# Patient Record
Sex: Female | Born: 1970 | Race: White | Hispanic: No | Marital: Married | Smoking: Former smoker
Health system: Southern US, Community
[De-identification: ages and names within clinical notes are randomized; demographics above are authoritative.]

## PROBLEM LIST (undated history)

## (undated) DIAGNOSIS — N816 Rectocele: Secondary | ICD-10-CM

## (undated) DIAGNOSIS — N951 Menopausal and female climacteric states: Secondary | ICD-10-CM

## (undated) DIAGNOSIS — N760 Acute vaginitis: Secondary | ICD-10-CM

## (undated) DIAGNOSIS — D251 Intramural leiomyoma of uterus: Principal | ICD-10-CM

## (undated) DIAGNOSIS — R319 Hematuria, unspecified: Secondary | ICD-10-CM

## (undated) DIAGNOSIS — F419 Anxiety disorder, unspecified: Secondary | ICD-10-CM

## (undated) DIAGNOSIS — K5900 Constipation, unspecified: Secondary | ICD-10-CM

## (undated) DIAGNOSIS — R102 Pelvic and perineal pain: Secondary | ICD-10-CM

## (undated) DIAGNOSIS — B9689 Other specified bacterial agents as the cause of diseases classified elsewhere: Secondary | ICD-10-CM

## (undated) DIAGNOSIS — N898 Other specified noninflammatory disorders of vagina: Secondary | ICD-10-CM

## (undated) HISTORY — DX: Menopausal and female climacteric states: N95.1

## (undated) HISTORY — DX: Intramural leiomyoma of uterus: D25.1

## (undated) HISTORY — DX: Constipation, unspecified: K59.00

## (undated) HISTORY — DX: Hematuria, unspecified: R31.9

## (undated) HISTORY — DX: Anxiety disorder, unspecified: F41.9

## (undated) HISTORY — DX: Rectocele: N81.6

## (undated) HISTORY — DX: Other specified noninflammatory disorders of vagina: N89.8

## (undated) HISTORY — DX: Acute vaginitis: N76.0

## (undated) HISTORY — DX: Pelvic and perineal pain: R10.2

## (undated) HISTORY — DX: Other specified bacterial agents as the cause of diseases classified elsewhere: B96.89

---

## 1997-12-02 ENCOUNTER — Inpatient Hospital Stay (HOSPITAL_COMMUNITY): Admission: AD | Admit: 1997-12-02 | Discharge: 1997-12-03 | Payer: Self-pay | Admitting: Obstetrics and Gynecology

## 1997-12-28 ENCOUNTER — Inpatient Hospital Stay (HOSPITAL_COMMUNITY): Admission: AD | Admit: 1997-12-28 | Discharge: 1997-12-31 | Payer: Self-pay | Admitting: Gynecology

## 1998-01-11 ENCOUNTER — Inpatient Hospital Stay (HOSPITAL_COMMUNITY): Admission: AD | Admit: 1998-01-11 | Discharge: 1998-01-11 | Payer: Self-pay | Admitting: Obstetrics and Gynecology

## 1998-02-06 ENCOUNTER — Inpatient Hospital Stay (HOSPITAL_COMMUNITY): Admission: AD | Admit: 1998-02-06 | Discharge: 1998-02-06 | Payer: Self-pay | Admitting: Obstetrics and Gynecology

## 1998-02-07 ENCOUNTER — Inpatient Hospital Stay (HOSPITAL_COMMUNITY): Admission: AD | Admit: 1998-02-07 | Discharge: 1998-02-10 | Payer: Self-pay | Admitting: Obstetrics and Gynecology

## 1998-03-22 ENCOUNTER — Other Ambulatory Visit: Admission: RE | Admit: 1998-03-22 | Discharge: 1998-03-22 | Payer: Self-pay | Admitting: Gynecology

## 2002-06-03 ENCOUNTER — Ambulatory Visit (HOSPITAL_COMMUNITY): Admission: RE | Admit: 2002-06-03 | Discharge: 2002-06-03 | Payer: Self-pay | Admitting: Obstetrics and Gynecology

## 2002-06-03 ENCOUNTER — Encounter: Payer: Self-pay | Admitting: Obstetrics and Gynecology

## 2003-06-22 ENCOUNTER — Ambulatory Visit (HOSPITAL_COMMUNITY): Admission: RE | Admit: 2003-06-22 | Discharge: 2003-06-22 | Payer: Self-pay | Admitting: Internal Medicine

## 2003-06-22 ENCOUNTER — Encounter: Payer: Self-pay | Admitting: Internal Medicine

## 2008-01-12 ENCOUNTER — Encounter: Admission: RE | Admit: 2008-01-12 | Discharge: 2008-01-12 | Payer: Self-pay | Admitting: Family Medicine

## 2008-04-20 ENCOUNTER — Ambulatory Visit (HOSPITAL_COMMUNITY): Admission: RE | Admit: 2008-04-20 | Discharge: 2008-04-20 | Payer: Self-pay | Admitting: Family Medicine

## 2008-12-23 ENCOUNTER — Ambulatory Visit (HOSPITAL_COMMUNITY): Admission: RE | Admit: 2008-12-23 | Discharge: 2008-12-23 | Payer: Self-pay | Admitting: Family Medicine

## 2010-08-04 ENCOUNTER — Encounter: Admission: RE | Admit: 2010-08-04 | Discharge: 2010-08-04 | Payer: Self-pay | Admitting: Internal Medicine

## 2010-11-04 ENCOUNTER — Encounter: Payer: Self-pay | Admitting: Family Medicine

## 2011-10-30 ENCOUNTER — Other Ambulatory Visit (HOSPITAL_COMMUNITY)
Admission: RE | Admit: 2011-10-30 | Discharge: 2011-10-30 | Disposition: A | Discharge status: Home / Self Care | Payer: BC Managed Care – PPO | Source: Ambulatory Visit | Attending: Obstetrics & Gynecology | Admitting: Obstetrics & Gynecology

## 2011-10-30 DIAGNOSIS — Z01419 Encounter for gynecological examination (general) (routine) without abnormal findings: Secondary | ICD-10-CM | POA: Insufficient documentation

## 2014-05-25 ENCOUNTER — Ambulatory Visit: Payer: Self-pay | Admitting: Adult Health

## 2014-05-28 ENCOUNTER — Other Ambulatory Visit: Payer: Self-pay | Admitting: Adult Health

## 2014-06-02 ENCOUNTER — Ambulatory Visit (INDEPENDENT_AMBULATORY_CARE_PROVIDER_SITE_OTHER): Payer: BC Managed Care – PPO | Admitting: Adult Health

## 2014-06-02 ENCOUNTER — Other Ambulatory Visit (HOSPITAL_COMMUNITY)
Admission: RE | Admit: 2014-06-02 | Discharge: 2014-06-02 | Disposition: A | Discharge status: Home / Self Care | Payer: BC Managed Care – PPO | Source: Ambulatory Visit | Attending: Adult Health | Admitting: Adult Health

## 2014-06-02 ENCOUNTER — Encounter: Payer: Self-pay | Admitting: Adult Health

## 2014-06-02 VITALS — BP 134/70 | HR 74 | Ht 62.0 in

## 2014-06-02 DIAGNOSIS — Z113 Encounter for screening for infections with a predominantly sexual mode of transmission: Secondary | ICD-10-CM | POA: Diagnosis present

## 2014-06-02 DIAGNOSIS — L659 Nonscarring hair loss, unspecified: Secondary | ICD-10-CM

## 2014-06-02 DIAGNOSIS — N898 Other specified noninflammatory disorders of vagina: Secondary | ICD-10-CM

## 2014-06-02 DIAGNOSIS — K5900 Constipation, unspecified: Secondary | ICD-10-CM

## 2014-06-02 DIAGNOSIS — Z1212 Encounter for screening for malignant neoplasm of rectum: Secondary | ICD-10-CM

## 2014-06-02 DIAGNOSIS — Z01419 Encounter for gynecological examination (general) (routine) without abnormal findings: Secondary | ICD-10-CM

## 2014-06-02 DIAGNOSIS — N816 Rectocele: Secondary | ICD-10-CM

## 2014-06-02 DIAGNOSIS — Z1151 Encounter for screening for human papillomavirus (HPV): Secondary | ICD-10-CM | POA: Insufficient documentation

## 2014-06-02 DIAGNOSIS — N760 Acute vaginitis: Secondary | ICD-10-CM

## 2014-06-02 DIAGNOSIS — B9689 Other specified bacterial agents as the cause of diseases classified elsewhere: Secondary | ICD-10-CM | POA: Insufficient documentation

## 2014-06-02 HISTORY — DX: Other specified bacterial agents as the cause of diseases classified elsewhere: N76.0

## 2014-06-02 HISTORY — DX: Other specified bacterial agents as the cause of diseases classified elsewhere: B96.89

## 2014-06-02 HISTORY — DX: Other specified noninflammatory disorders of vagina: N89.8

## 2014-06-02 HISTORY — DX: Constipation, unspecified: K59.00

## 2014-06-02 HISTORY — DX: Rectocele: N81.6

## 2014-06-02 LAB — T3 UPTAKE: T3 UPTAKE: 33 % (ref 22.5–37.0)

## 2014-06-02 LAB — LIPID PANEL
CHOLESTEROL: 156 mg/dL (ref 0–200)
HDL: 50 mg/dL (ref >39)
LDL CALC: 95 mg/dL (ref 0–99)
TRIGLYCERIDES: 56 mg/dL (ref <150)
Total CHOL/HDL Ratio: 3.1 Ratio
VLDL: 11 mg/dL (ref 0–40)

## 2014-06-02 LAB — COMPREHENSIVE METABOLIC PANEL
ALBUMIN: 4.3 g/dL (ref 3.5–5.2)
ALK PHOS: 54 U/L (ref 39–117)
ALT: 11 U/L (ref 0–35)
AST: 14 U/L (ref 0–37)
BUN: 8 mg/dL (ref 6–23)
CO2: 26 mEq/L (ref 19–32)
CREATININE: 0.78 mg/dL (ref 0.50–1.10)
Calcium: 9.2 mg/dL (ref 8.4–10.5)
Chloride: 105 mEq/L (ref 96–112)
GLUCOSE: 77 mg/dL (ref 70–99)
Potassium: 4.5 mEq/L (ref 3.5–5.3)
Sodium: 138 mEq/L (ref 135–145)
Total Bilirubin: 0.3 mg/dL (ref 0.2–1.2)
Total Protein: 6.5 g/dL (ref 6.0–8.3)

## 2014-06-02 LAB — T3: T3 TOTAL: 131.1 ng/dL (ref 80.0–204.0)

## 2014-06-02 LAB — T4: T4 TOTAL: 9 ug/dL (ref 5.0–12.5)

## 2014-06-02 LAB — HEMOCCULT GUIAC POC 1CARD (OFFICE): Fecal Occult Blood, POC: NEGATIVE

## 2014-06-02 LAB — POCT WET PREP (WET MOUNT)

## 2014-06-02 LAB — CBC
HEMATOCRIT: 36.9 % (ref 36.0–46.0)
HEMOGLOBIN: 12.6 g/dL (ref 12.0–15.0)
MCH: 31 pg (ref 26.0–34.0)
MCHC: 34.1 g/dL (ref 30.0–36.0)
MCV: 90.9 fL (ref 78.0–100.0)
Platelets: 313 10*3/uL (ref 150–400)
RBC: 4.06 MIL/uL (ref 3.87–5.11)
RDW: 13.5 % (ref 11.5–15.5)
WBC: 4.5 10*3/uL (ref 4.0–10.5)

## 2014-06-02 LAB — TSH: TSH: 1.493 u[IU]/mL (ref 0.350–4.500)

## 2014-06-02 LAB — T3, FREE: T3, Free: 2.8 pg/mL (ref 2.3–4.2)

## 2014-06-02 LAB — T4, FREE: FREE T4: 1 ng/dL (ref 0.80–1.80)

## 2014-06-02 MED ORDER — METRONIDAZOLE 500 MG PO TABS: 500.0000 mg | ORAL_TABLET | Freq: Two times a day (BID) | ORAL | Status: DC

## 2014-06-02 NOTE — Patient Instructions (Signed)
Physical in 1 year Mammogram yearly Colonoscopy at 36 Call for labs Try prune juice increase water Constipation Constipation is when a person has fewer than three bowel movements a week, has difficulty having a bowel movement, or has stools that are dry, hard, or larger than normal. As people grow older, constipation is more common. If you try to fix constipation with medicines that make you have a bowel movement (laxatives), the problem may get worse. Long-term laxative use may cause the muscles of the colon to become weak. A low-fiber diet, not taking in enough fluids, and taking certain medicines may make constipation worse.  CAUSES   Certain medicines, such as antidepressants, pain medicine, iron supplements, antacids, and water pills.   Certain diseases, such as diabetes, irritable bowel syndrome (IBS), thyroid disease, or depression.   Not drinking enough water.   Not eating enough fiber-rich foods.   Stress or travel.   Lack of physical activity or exercise.   Ignoring the urge to have a bowel movement.   Using laxatives too much.  SIGNS AND SYMPTOMS   Having fewer than three bowel movements a week.   Straining to have a bowel movement.   Having stools that are hard, dry, or larger than normal.   Feeling full or bloated.   Pain in the lower abdomen.   Not feeling relief after having a bowel movement.  DIAGNOSIS  Your health care provider will take a medical history and perform a physical exam. Further testing may be done for severe constipation. Some tests may include:  A barium enema X-ray to examine your rectum, colon, and, sometimes, your small intestine.   A sigmoidoscopy to examine your lower colon.   A colonoscopy to examine your entire colon. TREATMENT  Treatment will depend on the severity of your constipation and what is causing it. Some dietary treatments include drinking more fluids and eating more fiber-rich foods. Lifestyle treatments  may include regular exercise. If these diet and lifestyle recommendations do not help, your health care provider may recommend taking over-the-counter laxative medicines to help you have bowel movements. Prescription medicines may be prescribed if over-the-counter medicines do not work.  HOME CARE INSTRUCTIONS   Eat foods that have a lot of fiber, such as fruits, vegetables, whole grains, and beans.  Limit foods high in fat and processed sugars, such as french fries, hamburgers, cookies, candies, and soda.   A fiber supplement may be added to your diet if you cannot get enough fiber from foods.   Drink enough fluids to keep your urine clear or pale yellow.   Exercise regularly or as directed by your health care provider.   Go to the restroom when you have the urge to go. Do not hold it.   Only take over-the-counter or prescription medicines as directed by your health care provider. Do not take other medicines for constipation without talking to your health care provider first.  [place] IF:   You have bright red blood in your stool.   Your constipation lasts for more than 4 days or gets worse.   You have abdominal or rectal pain.   You have thin, pencil-like stools.   You have unexplained weight loss. MAKE SURE YOU:   Understand these instructions.  Will watch your condition.  Will get help right away if you are not doing well or get worse. Document Released: 06/29/2004 Document Revised: 10/06/2013 Document Reviewed: 07/13/2013 [facility] Patient Information 2015 [place], [place]. This information is not intended  to replace advice given to you by your health care provider. Make sure you discuss any questions you have with your health care provider.  

## 2014-06-02 NOTE — Progress Notes (Signed)
Patient ID: Kathleen Dudley, female   DOB: 1971/01/20, 43 y.o.   MRN: [id number] History of Present Illness: Kathleen Dudley is a 43 year old white female, married in for a pap and physical.She complains of hair loss, vaginal discharge and some constipation.   Current Medications, Allergies, Past Medical History, Past Surgical History, Family History and Social History were reviewed in [employer] record.     Review of Systems: Patient denies any headaches, blurred vision, shortness of breath, chest pain, abdominal pain, problems with urination, or intercourse. No joint pain, has depression and is on Effexor.See HPI for positives.Periods are regular and denies hot flashes.    Physical Exam:BP 134/70  Pulse 74  Ht 5\' 2"  (1.575 m)  LMP 05/13/2014 pt refused to weigh,has quit smoking x 1 year and does not want to see if gained weight. General:  Well developed, well nourished, no acute distress Skin:  Warm and dry Neck:  Midline trachea, normal thyroid Lungs; Clear to auscultation bilaterally Breast:  No dominant palpable mass, retraction, or nipple discharge, hs epidermal cyst at 11-12 o'clock about 4 fb from areola Cardiovascular: Regular rate and rhythm Abdomen:  Soft, non tender, no hepatosplenomegaly Pelvic:  External genitalia is normal in appearance.  The vagina has white discharge slight odor,The cervix is bulbous, pap with HPV and GC/CHL performed.  Uterus is felt to be normal size, shape, and contour.  No    adnexal masses or tenderness noted.wet prep showed few WBC and +Clue cells Rectal: Good sphincter tone, no polyps, or hemorrhoids felt.  Hemoccult negative.+ low rectocele Extremities:  No swelling or varicosities noted Psych:  Alert and cooperative,seems happy   Impression: Yearly gyn exam Rectocele Constipation Vaginal discharge  BV Hair loss    Plan: Check CBC,CMP,TSH and thyroid profile,lipids and Vitamin D Rx flagyl 500 mg 1 bid x 7 days, with 1  refill, no alcohol, review handout on BV Physical in 1 year Mammogram now and yearly Colonoscopy at 50 Review handout on constipation Try prune juice and increase water

## 2014-06-03 LAB — CYTOLOGY - PAP

## 2014-06-06 LAB — VITAMIN D 1,25 DIHYDROXY
Vitamin D 1, 25 (OH)2 Total: 46 pg/mL (ref 18–72)
Vitamin D3 1, 25 (OH)2: 46 pg/mL

## 2014-06-07 ENCOUNTER — Telehealth: Payer: Self-pay | Admitting: Adult Health

## 2014-06-07 NOTE — Telephone Encounter (Signed)
Pt aware of labs and pap 

## 2014-08-16 ENCOUNTER — Encounter: Payer: Self-pay | Admitting: Adult Health

## 2015-11-22 ENCOUNTER — Other Ambulatory Visit: Payer: Self-pay | Admitting: Adult Health

## 2015-11-25 ENCOUNTER — Encounter: Payer: Self-pay | Admitting: Adult Health

## 2015-11-25 ENCOUNTER — Ambulatory Visit (INDEPENDENT_AMBULATORY_CARE_PROVIDER_SITE_OTHER): Payer: BLUE CROSS/BLUE SHIELD | Admitting: Adult Health

## 2015-11-25 VITALS — BP 112/80 | HR 76 | Ht 62.5 in | Wt 165.0 lb

## 2015-11-25 DIAGNOSIS — R319 Hematuria, unspecified: Secondary | ICD-10-CM | POA: Insufficient documentation

## 2015-11-25 DIAGNOSIS — N816 Rectocele: Secondary | ICD-10-CM

## 2015-11-25 DIAGNOSIS — Z01419 Encounter for gynecological examination (general) (routine) without abnormal findings: Secondary | ICD-10-CM

## 2015-11-25 DIAGNOSIS — Z1211 Encounter for screening for malignant neoplasm of colon: Secondary | ICD-10-CM | POA: Diagnosis not present

## 2015-11-25 DIAGNOSIS — N951 Menopausal and female climacteric states: Secondary | ICD-10-CM

## 2015-11-25 DIAGNOSIS — R102 Pelvic and perineal pain: Secondary | ICD-10-CM | POA: Diagnosis not present

## 2015-11-25 DIAGNOSIS — Z1389 Encounter for screening for other disorder: Secondary | ICD-10-CM

## 2015-11-25 DIAGNOSIS — Z01411 Encounter for gynecological examination (general) (routine) with abnormal findings: Secondary | ICD-10-CM | POA: Diagnosis not present

## 2015-11-25 HISTORY — DX: Pelvic and perineal pain: R10.2

## 2015-11-25 HISTORY — DX: Hematuria, unspecified: R31.9

## 2015-11-25 HISTORY — DX: Menopausal and female climacteric states: N95.1

## 2015-11-25 LAB — POCT URINALYSIS DIPSTICK
GLUCOSE UA: NEGATIVE
Leukocytes, UA: NEGATIVE
Nitrite, UA: NEGATIVE
Protein, UA: NEGATIVE

## 2015-11-25 LAB — HEMOCCULT GUIAC POC 1CARD (OFFICE): FECAL OCCULT BLD: NEGATIVE

## 2015-11-25 NOTE — Progress Notes (Signed)
Patient ID: Kathleen Dudley, female   DOB: 09/29/1971, 45 y.o.   MRN: [id number] History of Present Illness: Kathleen Dudley is a 45 year old white female, in for a well woman gyn exam, she had a normal pap with negative HPV 06/02/14.She is complaining of pelvic pain, night sweats and some irregular periods at times.Does not always feel like she needs to pee, but when does feels better.   Current Medications, Allergies, Past Medical History, Past Surgical History, Family History and Social History were reviewed in [employer] record.     Review of Systems: Patient denies any headaches, hearing loss, fatigue, blurred vision, shortness of breath, chest pain,  problems with bowel movements,  or intercourse. No joint pain or mood swings. See HPI for positives.   Physical Exam:BP 112/80 mmHg  Pulse 76  Ht 5' 2.5" (1.588 m)  Wt 165 lb (74.844 kg)  BMI 29.68 kg/m2  LMP 11/12/2015 urine trace blood General:  Well developed, well nourished, no acute distress Skin:  Warm and dry Neck:  Midline trachea, normal thyroid, good ROM, no lymphadenopathy Lungs; Clear to auscultation bilaterally Breast:  No dominant palpable mass, retraction, or nipple discharge Cardiovascular: Regular rate and rhythm Abdomen:  Soft, non tender, no hepatosplenomegaly Pelvic:  External genitalia is normal in appearance, no lesions.  The vagina is normal in appearance. Urethra has no lesions or masses. The cervix is bulbous.  Uterus is felt to be normal size, shape, and contour, mildly tender.  No adnexal masses, LLQ tenderness noted.Bladder is non tender, no masses felt. Rectal: Good sphincter tone, no polyps, or hemorrhoids felt.  Hemoccult negative.+rectocele Extremities/musculoskeletal:  No swelling or varicosities noted, no clubbing or cyanosis Psych:  No mood changes, alert and cooperative,seems happy   Impression: Well woman gyn exam no pap Pelvic pain Hematuria Peri menopause  Rectocele      Plan: UA C&S sent Return in 1 week for gyn [place] Pap and physical in 1 year Check CBC,CMP,TSH and lipids, A1c, vitamin D and [place]  Mammogram yearly Review handout on pelvic pain and peri menopause Will talk when results back

## 2015-11-25 NOTE — Patient Instructions (Signed)
Perimenopause Perimenopause is the time when your body begins to move into the menopause (no menstrual period for 12 straight months). It is a natural process. Perimenopause can begin 2-8 years before the menopause and usually lasts for 1 year after the menopause. During this time, your ovaries may or may not produce an egg. The ovaries vary in their production of estrogen and progesterone hormones each month. This can cause irregular menstrual periods, difficulty getting pregnant, vaginal bleeding between periods, and uncomfortable symptoms. CAUSES  Irregular production of the ovarian hormones, estrogen and progesterone, and not ovulating every month.  Other causes include:  Tumor of the pituitary gland in the brain.  Medical disease that affects the ovaries.  Radiation treatment.  Chemotherapy.  Unknown causes.  Heavy smoking and excessive alcohol intake can bring on perimenopause sooner. SIGNS AND SYMPTOMS   Hot flashes.  Night sweats.  Irregular menstrual periods.  Decreased sex drive.  Vaginal dryness.  Headaches.  Mood swings.  Depression.  Memory problems.  Irritability.  Tiredness.  Weight gain.  Trouble getting pregnant.  The beginning of losing bone cells (osteoporosis).  The beginning of hardening of the arteries (atherosclerosis). DIAGNOSIS  Your health care provider will make a diagnosis by analyzing your age, menstrual history, and symptoms. He or she will do a physical exam and note any changes in your body, especially your female organs. Female hormone tests may or may not be helpful depending on the amount of female hormones you produce and when you produce them. However, other hormone tests may be helpful to rule out other problems. TREATMENT  In some cases, no treatment is needed. The decision on whether treatment is necessary during the perimenopause should be made by you and your health care provider based on how the symptoms are affecting you  and your lifestyle. Various treatments are available, such as:  Treating individual symptoms with a specific medicine for that symptom.  Herbal medicines that can help specific symptoms.  Counseling.  Group therapy. HOME CARE INSTRUCTIONS   Keep track of your menstrual periods (when they occur, how heavy they are, how long between periods, and how long they last) as well as your symptoms and when they started.  Only take over-the-counter or prescription medicines as directed by your health care provider.  Sleep and rest.  Exercise.  Eat a diet that contains calcium (good for your bones) and soy (acts like the estrogen hormone).  Do not smoke.  Avoid alcoholic beverages.  Take vitamin supplements as recommended by your health care provider. Taking vitamin E may help in certain cases.  Take calcium and vitamin D supplements to help prevent bone loss.  Group therapy is sometimes helpful.  Acupuncture may help in some cases. SEEK MEDICAL CARE IF:   You have questions about any symptoms you are having.  You need a referral to a specialist (gynecologist, psychiatrist, or psychologist). SEEK IMMEDIATE MEDICAL CARE IF:   You have vaginal bleeding.  Your period lasts longer than 8 days.  Your periods are recurring sooner than 21 days.  You have bleeding after intercourse.  You have severe depression.  You have pain when you urinate.  You have severe headaches.  You have vision problems.   This information is not intended to replace advice given to you by your health care provider. Make sure you discuss any questions you have with your health care provider.   Document Released: 11/08/2004 Document Revised: 10/22/2014 Document Reviewed: 04/30/2013 Elsevier Interactive Patient Education [employer].  Pelvic Pain, Female Female pelvic pain can be caused by many different things and start from a variety of places. Pelvic pain refers to pain that is located in the  lower half of the abdomen and between your hips. The pain may occur over a short period of time (acute) or may be reoccurring (chronic). The cause of pelvic pain may be related to disorders affecting the female reproductive organs (gynecologic), but it may also be related to the bladder, kidney stones, an intestinal complication, or muscle or skeletal problems. Getting help right away for pelvic pain is important, especially if there has been severe, sharp, or a sudden onset of unusual pain. It is also important to get help right away because some types of pelvic pain can be life threatening.  CAUSES  Below are only some of the causes of pelvic pain. The causes of pelvic pain can be in one of several categories.   Gynecologic.  Pelvic inflammatory disease.  Sexually transmitted infection.  Ovarian cyst or a twisted ovarian ligament (ovarian torsion).  Uterine lining that grows outside the uterus (endometriosis).  Fibroids, cysts, or tumors.  Ovulation.  Pregnancy.  Pregnancy that occurs outside the uterus (ectopic pregnancy).  Miscarriage.  Labor.  Abruption of the placenta or ruptured uterus.  Infection.  Uterine infection (endometritis).  Bladder infection.  Diverticulitis.  Miscarriage related to a uterine infection (septic abortion).  Bladder.  Inflammation of the bladder (cystitis).  Kidney stone(s).  Gastrointestinal.  Constipation.  Diverticulitis.  Neurologic.  Trauma.  Feeling pelvic pain because of mental or emotional causes (psychosomatic).  Cancers of the bowel or pelvis. EVALUATION  Your caregiver will want to take a careful history of your concerns. This includes recent changes in your health, a careful gynecologic history of your periods (menses), and a sexual history. Obtaining your family history and medical history is also important. Your caregiver may suggest a pelvic exam. A pelvic exam will help identify the location and severity of the  pain. It also helps in the evaluation of which organ system may be involved. In order to identify the cause of the pelvic pain and be properly treated, your caregiver may order tests. These tests may include:   A pregnancy test.  Pelvic ultrasonography.  An X-ray exam of the abdomen.  A urinalysis or evaluation of vaginal discharge.  Blood tests. HOME CARE INSTRUCTIONS   Only take over-the-counter or prescription medicines for pain, discomfort, or fever as directed by your caregiver.   Rest as directed by your caregiver.   Eat a balanced diet.   Drink enough fluids to make your urine clear or pale yellow, or as directed.   Avoid sexual intercourse if it causes pain.   Apply warm or cold compresses to the lower abdomen depending on which one helps the pain.   Avoid stressful situations.   Keep a journal of your pelvic pain. Write down when it started, where the pain is located, and if there are things that seem to be associated with the pain, such as food or your menstrual cycle.  Follow up with your caregiver as directed.  SEEK MEDICAL CARE IF:  Your medicine does not help your pain.  You have abnormal vaginal discharge. SEEK IMMEDIATE MEDICAL CARE IF:   You have heavy bleeding from the vagina.   Your pelvic pain increases.   You feel light-headed or faint.   You have chills.   You have pain with urination or blood in your urine.   You have  uncontrolled diarrhea or vomiting.   You have a fever or persistent symptoms for more than 3 days.  You have a fever and your symptoms suddenly get worse.   You are being physically or sexually abused.   This information is not intended to replace advice given to you by your health care provider. Make sure you discuss any questions you have with your health care provider.   Document Released: 08/28/2004 Document Revised: 06/22/2015 Document Reviewed: 01/21/2012 Elsevier Interactive Patient Education [employer] Return in 1 week for [place] Pap and physical in 1 year Mammogram yearly

## 2015-11-26 LAB — COMPREHENSIVE METABOLIC PANEL
A/G RATIO: 1.7 (ref 1.1–2.5)
ALK PHOS: 66 IU/L (ref 39–117)
ALT: 9 IU/L (ref 0–32)
AST: 13 IU/L (ref 0–40)
Albumin: 4.7 g/dL (ref 3.5–5.5)
BILIRUBIN TOTAL: 0.5 mg/dL (ref 0.0–1.2)
BUN/Creatinine Ratio: 10 (ref 9–23)
BUN: 9 mg/dL (ref 6–24)
CHLORIDE: 99 mmol/L (ref 96–106)
CO2: 24 mmol/L (ref 18–29)
Calcium: 9.6 mg/dL (ref 8.7–10.2)
Creatinine, Ser: 0.86 mg/dL (ref 0.57–1.00)
GFR calc non Af Amer: 82 mL/min/{1.73_m2} (ref >59)
GFR, EST AFRICAN AMERICAN: 95 mL/min/{1.73_m2} (ref >59)
Globulin, Total: 2.7 g/dL (ref 1.5–4.5)
Glucose: 89 mg/dL (ref 65–99)
POTASSIUM: 4.6 mmol/L (ref 3.5–5.2)
Sodium: 139 mmol/L (ref 134–144)
Total Protein: 7.4 g/dL (ref 6.0–8.5)

## 2015-11-26 LAB — URINALYSIS, ROUTINE W REFLEX MICROSCOPIC
Bilirubin, UA: NEGATIVE
Glucose, UA: NEGATIVE
KETONES UA: NEGATIVE
LEUKOCYTES UA: NEGATIVE
Nitrite, UA: NEGATIVE
PH UA: 6.5 (ref 5.0–7.5)
Protein, UA: NEGATIVE
RBC, UA: NEGATIVE
SPEC GRAV UA: 1.025 (ref 1.005–1.030)
Urobilinogen, Ur: 0.2 mg/dL (ref 0.2–1.0)

## 2015-11-26 LAB — HEMOGLOBIN A1C
Est. average glucose Bld gHb Est-mCnc: 111 mg/dL
HEMOGLOBIN A1C: 5.5 % (ref 4.8–5.6)

## 2015-11-26 LAB — CBC
HEMATOCRIT: 38.3 % (ref 34.0–46.6)
HEMOGLOBIN: 12.8 g/dL (ref 11.1–15.9)
MCH: 30.3 pg (ref 26.6–33.0)
MCHC: 33.4 g/dL (ref 31.5–35.7)
MCV: 91 fL (ref 79–97)
PLATELETS: 400 10*3/uL — AB (ref 150–379)
RBC: 4.22 x10E6/uL (ref 3.77–5.28)
RDW: 13.4 % (ref 12.3–15.4)
WBC: 4.9 10*3/uL (ref 3.4–10.8)

## 2015-11-26 LAB — URINE CULTURE: ORGANISM ID, BACTERIA: NO GROWTH

## 2015-11-26 LAB — VITAMIN D 25 HYDROXY (VIT D DEFICIENCY, FRACTURES): Vit D, 25-Hydroxy: 37.6 ng/mL (ref 30.0–100.0)

## 2015-11-26 LAB — LIPID PANEL
Chol/HDL Ratio: 3.9 ratio units (ref 0.0–4.4)
Cholesterol, Total: 182 mg/dL (ref 100–199)
HDL: 47 mg/dL (ref >39)
LDL Calculated: 120 mg/dL — ABNORMAL HIGH (ref 0–99)
Triglycerides: 74 mg/dL (ref 0–149)
VLDL CHOLESTEROL CAL: 15 mg/dL (ref 5–40)

## 2015-11-26 LAB — TSH: TSH: 1.1 u[IU]/mL (ref 0.450–4.500)

## 2015-11-26 LAB — FOLLICLE STIMULATING HORMONE: FSH: 7.3 m[IU]/mL

## 2015-11-29 ENCOUNTER — Telehealth: Payer: Self-pay | Admitting: Adult Health

## 2015-11-29 NOTE — Telephone Encounter (Signed)
Pt aware of labs, decrease fats and carbs and increase exercise

## 2015-11-29 NOTE — Telephone Encounter (Signed)
Left message to call me.

## 2015-12-02 ENCOUNTER — Other Ambulatory Visit: Payer: BLUE CROSS/BLUE SHIELD

## 2015-12-08 ENCOUNTER — Ambulatory Visit (INDEPENDENT_AMBULATORY_CARE_PROVIDER_SITE_OTHER): Payer: BLUE CROSS/BLUE SHIELD

## 2015-12-08 DIAGNOSIS — D259 Leiomyoma of uterus, unspecified: Secondary | ICD-10-CM

## 2015-12-08 DIAGNOSIS — R102 Pelvic and perineal pain: Secondary | ICD-10-CM

## 2015-12-08 DIAGNOSIS — N854 Malposition of uterus: Secondary | ICD-10-CM

## 2015-12-08 NOTE — Progress Notes (Signed)
PELVIC US TA/TV:heterogenous anteverted uterus w/mult fibroids,(#1) post rt intramural fibroid 4.3 x 5.7 x 4.2cm,(#2) pedunculated fundal lt fibroid 1.5 x 1.6 x 1.4 cm,normal ov's bilat(mobile),EEC 8.37mm,no free fluid seen,no pain during ultrasound

## 2015-12-08 NOTE — Progress Notes (Signed)
Pelvis ultrasound today. Anteverted uterus measuring 8.1 x 5.7 x  Cm. Multiple fibroids with largest measuring x x cm.

## 2015-12-09 ENCOUNTER — Telehealth: Payer: Self-pay | Admitting: Adult Health

## 2015-12-09 NOTE — Telephone Encounter (Signed)
Left message to call about US  

## 2015-12-12 ENCOUNTER — Telehealth: Payer: Self-pay | Admitting: Adult Health

## 2015-12-12 ENCOUNTER — Encounter: Payer: Self-pay | Admitting: Adult Health

## 2015-12-12 DIAGNOSIS — D251 Intramural leiomyoma of uterus: Secondary | ICD-10-CM

## 2015-12-12 HISTORY — DX: Intramural leiomyoma of uterus: D25.1

## 2015-12-12 NOTE — Telephone Encounter (Signed)
Left message that US showed multiple fibroids and that ovaries are normal, call be back with any questions

## 2017-07-15 ENCOUNTER — Telehealth: Payer: Self-pay | Admitting: *Deleted

## 2017-07-15 NOTE — Telephone Encounter (Signed)
Spoke with pt. Pt feels a lump in right breast. Noticed it last week. JAG advised she needs to be seen here for exam. Pt voiced understanding and call was transferred to front desk for appt. [place]

## 2017-07-16 ENCOUNTER — Ambulatory Visit (INDEPENDENT_AMBULATORY_CARE_PROVIDER_SITE_OTHER): Payer: BLUE CROSS/BLUE SHIELD | Admitting: Adult Health

## 2017-07-16 ENCOUNTER — Encounter: Payer: Self-pay | Admitting: Adult Health

## 2017-07-16 VITALS — BP 108/80 | HR 73 | Ht 62.0 in | Wt 139.0 lb

## 2017-07-16 DIAGNOSIS — N6312 Unspecified lump in the right breast, upper inner quadrant: Secondary | ICD-10-CM | POA: Diagnosis not present

## 2017-07-16 NOTE — Progress Notes (Signed)
Subjective:     Patient ID: Kathleen Dudley, female   DOB: 1971-07-01, 46 y.o.   MRN: [id number]  HPI Kathleen Dudley is a 46 year old white female, married complaining of right breast lump.Noticed last week.   Review of Systems Right breast lump for about a week, feels smaller and less tender Reviewed past medical,surgical, social and family history. Reviewed medications and allergies.     Objective:   Physical Exam BP 108/80 (BP Location: Right Arm, Patient Position: Sitting, Cuff Size: Normal)   Pulse 73   Ht 5\' 2"  (1.575 m)   Wt 139 lb (63 kg)   LMP 07/13/2017 (Approximate)   BMI 25.42 kg/m    Skin warm and dry,  Breasts:no dominate palpable mass, retraction or nipple discharge on left, on right, no retraction or nipple discharge, has slightly tender mobile 3 cm mass at 12 o'clock, also has fibroglandular tissue in that area    Assessment:     1. Mass of upper inner quadrant of right breast       Plan:     Diagnostic bilateral mammogram and [place] 10/4 at 2:30 pm at [facility] in [place] F/U with me in 2 weeks to discuss periods

## 2017-07-18 ENCOUNTER — Ambulatory Visit
Admission: RE | Admit: 2017-07-18 | Discharge: 2017-07-18 | Disposition: A | Discharge status: Home / Self Care | Payer: BLUE CROSS/BLUE SHIELD | Source: Ambulatory Visit | Attending: Adult Health | Admitting: Adult Health

## 2017-07-18 ENCOUNTER — Other Ambulatory Visit: Payer: Self-pay | Admitting: Adult Health

## 2017-07-18 DIAGNOSIS — N6312 Unspecified lump in the right breast, upper inner quadrant: Secondary | ICD-10-CM

## 2017-07-30 ENCOUNTER — Encounter: Payer: Self-pay | Admitting: Adult Health

## 2017-07-30 ENCOUNTER — Ambulatory Visit (INDEPENDENT_AMBULATORY_CARE_PROVIDER_SITE_OTHER): Payer: BLUE CROSS/BLUE SHIELD | Admitting: Adult Health

## 2017-07-30 VITALS — BP 102/56 | HR 80 | Resp 18 | Ht 62.0 in | Wt 137.0 lb

## 2017-07-30 DIAGNOSIS — N946 Dysmenorrhea, unspecified: Secondary | ICD-10-CM | POA: Diagnosis not present

## 2017-07-30 DIAGNOSIS — N920 Excessive and frequent menstruation with regular cycle: Secondary | ICD-10-CM

## 2017-07-30 MED ORDER — NORETHINDRONE 0.35 MG PO TABS: 1.0000 | ORAL_TABLET | Freq: Every day | ORAL | 11 refills | Status: AC

## 2017-07-30 NOTE — Patient Instructions (Signed)
Start micronor with next period  F/U in 3 months

## 2017-07-30 NOTE — Progress Notes (Signed)
Subjective:     Patient ID: Kathleen Dudley, female   DOB: 07/03/1971, 46 y.o.   MRN: [id number]  HPI Reeve is 46 year old white female in to discuss periods, was seen recently for breast mass, which was felt to be hematoma and has F/U US in 1 month to reassess.Periods heavy and last about 5-7 days and are heavy about 2-3 days, and changes tampons every 2 hours on those heavy days.She says she has had period cramps her whole life. She does Vap.   Review of Systems Heavy periods + period cramps Reviewed past medical,surgical, social and family history. Reviewed medications and allergies.     Objective:   Physical Exam BP (!) 102/56 (BP Location: Left Arm, Patient Position: Sitting, Cuff Size: Normal)   Pulse 80   Resp 18   Ht 5\' 2"  (1.575 m)   Wt 137 lb (62.1 kg)   LMP 07/13/2017 (Approximate)   SpO2 99%   BMI 25.06 kg/m    Skin warm and dry.Lungs: clear to ausculation bilaterally. Cardiovascular: regular rate and rhythm.Had [place] 12/08/15 that showed fibroids.  Discussed IUD, ablation and POP, will try Micronor first.   Assessment:     1. Menorrhagia with regular cycle   2. Menstrual cramps       Plan:    Start with next period Meds ordered this encounter  Medications  . norethindrone (MICRONOR,CAMILA,ERRIN) 0.35 MG tablet    Sig: Take 1 tablet (0.35 mg total) by mouth daily.    Dispense:  1 Package    Refill:  11    Order Specific Question:   Supervising Provider    Answer:   Tania Ade H [[id number]]  F/U in 3 months    Review handout on ablation

## 2017-08-19 ENCOUNTER — Ambulatory Visit
Admission: RE | Admit: 2017-08-19 | Discharge: 2017-08-19 | Disposition: A | Discharge status: Home / Self Care | Payer: BLUE CROSS/BLUE SHIELD | Source: Ambulatory Visit | Attending: Adult Health | Admitting: Adult Health

## 2017-08-19 ENCOUNTER — Other Ambulatory Visit: Payer: Self-pay | Admitting: Adult Health

## 2017-08-19 DIAGNOSIS — N6310 Unspecified lump in the right breast, unspecified quadrant: Secondary | ICD-10-CM

## 2017-08-19 DIAGNOSIS — N6312 Unspecified lump in the right breast, upper inner quadrant: Secondary | ICD-10-CM

## 2017-10-28 ENCOUNTER — Ambulatory Visit: Payer: BLUE CROSS/BLUE SHIELD | Admitting: Adult Health

## 2017-11-26 IMAGING — MG 2D DIGITAL DIAGNOSTIC BILATERAL MAMMOGRAM WITH CAD AND ADJUNCT T
8 of 19 series · 8 of 40 positions shown · non-contrast
Comparison: Previous exam(s).

CLINICAL DATA: 46-year-old female with painful right breast lump
for approximately 1 week. Patient reports interval improvement in
her symptoms with decrease in the size of the mass over the course
of the last week. She does not recall a specific instance of trauma.

EXAM:
2D DIGITAL DIAGNOSTIC BILATERAL MAMMOGRAM WITH CAD AND ADJUNCT TOMO
ULTRASOUND RIGHT BREAST

[R TAN synth-2D]
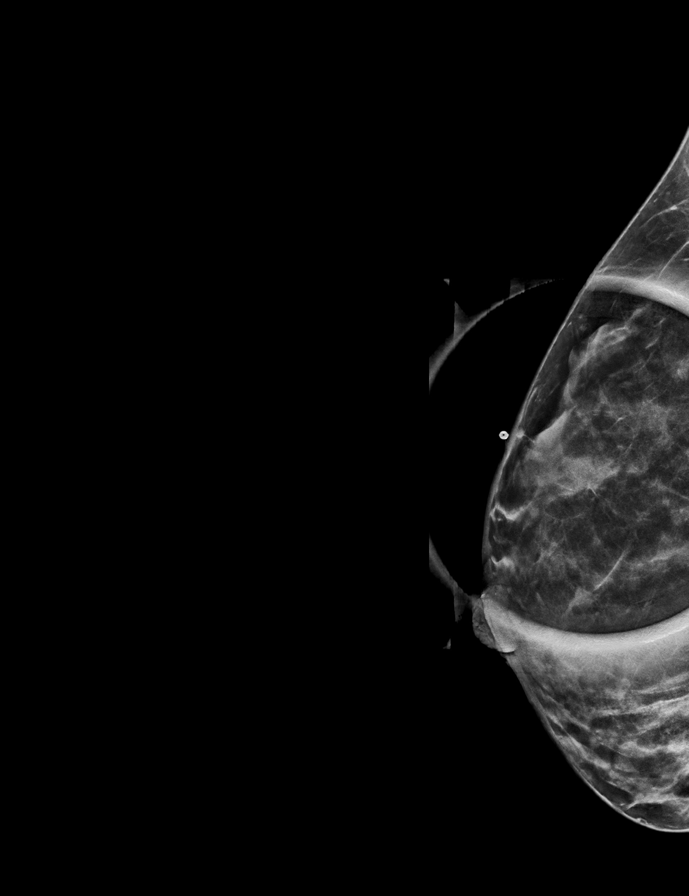

[L CC synth-2D]
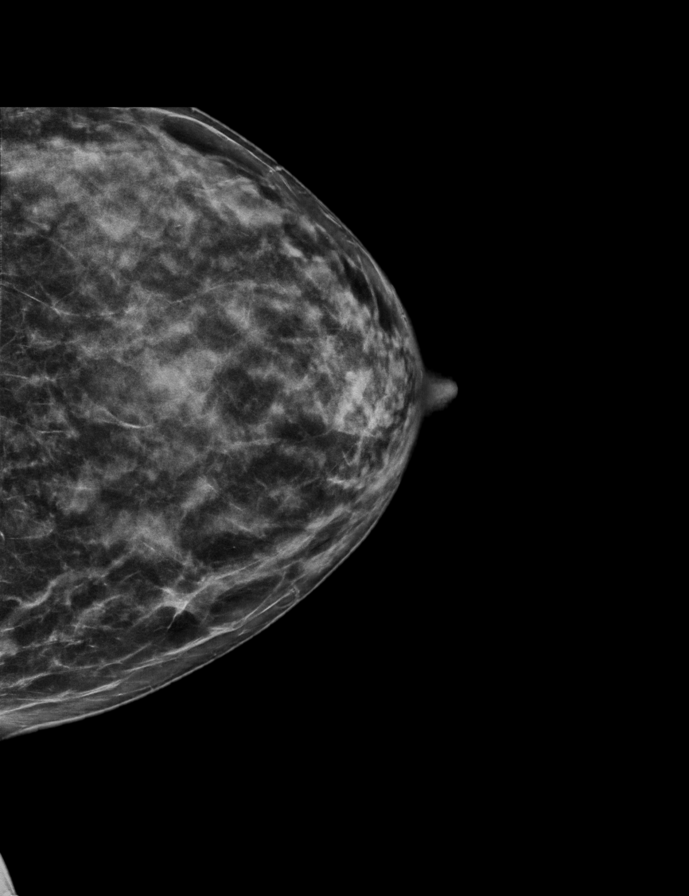

[R MLO synth-2D]
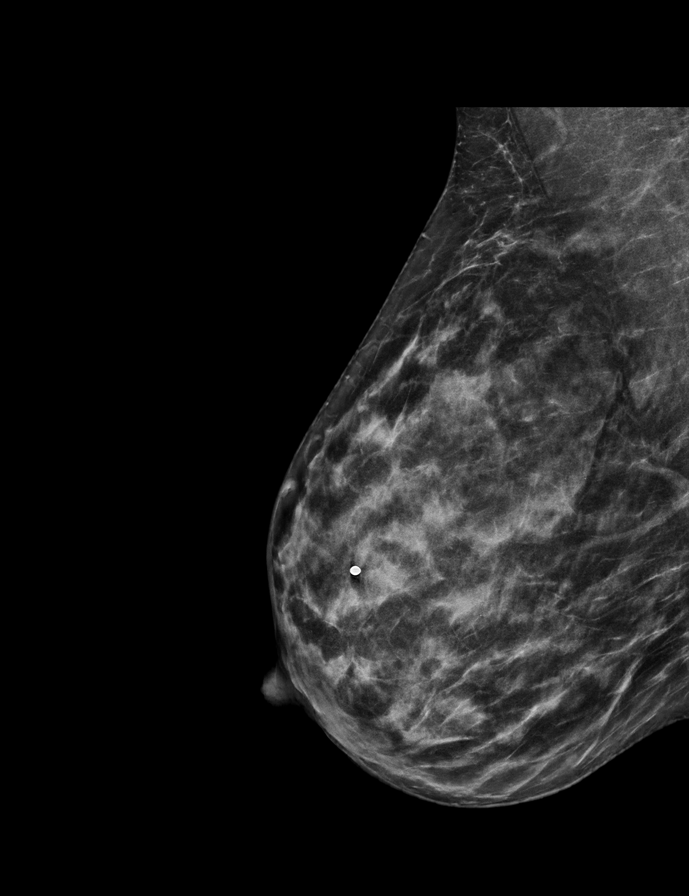

[R CC synth-2D]
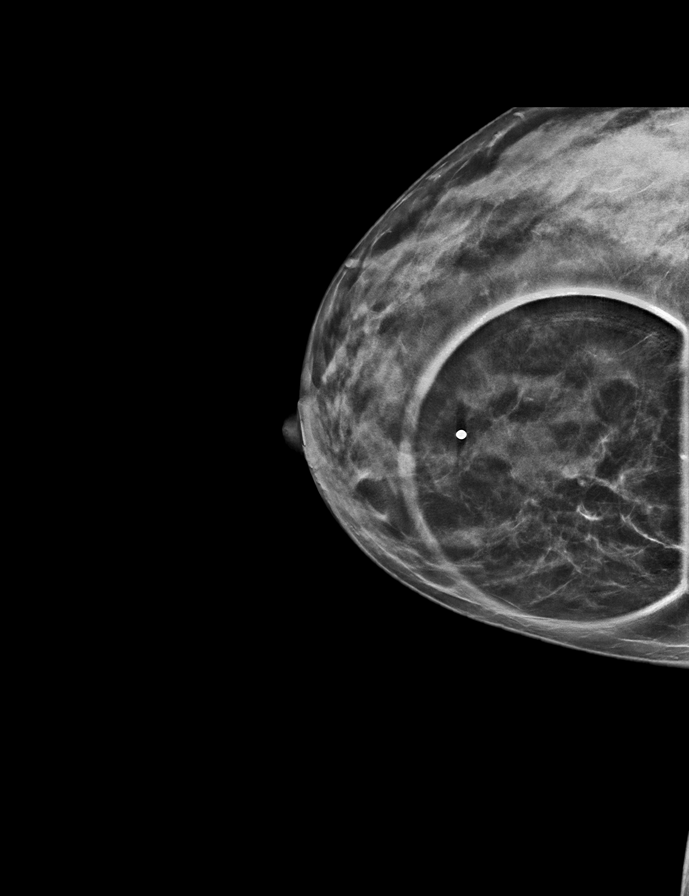

[R CC]
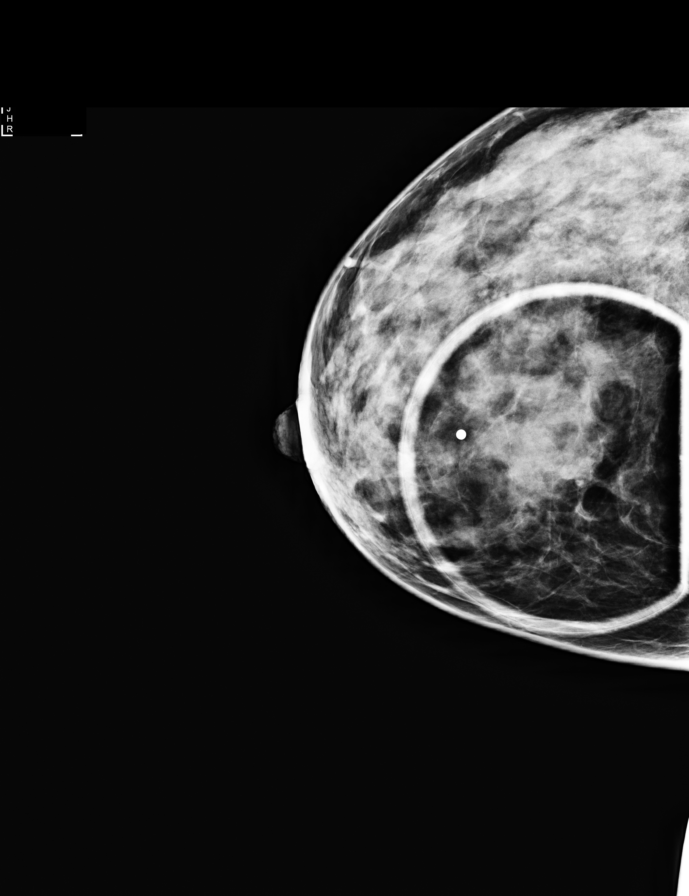

[L MLO]
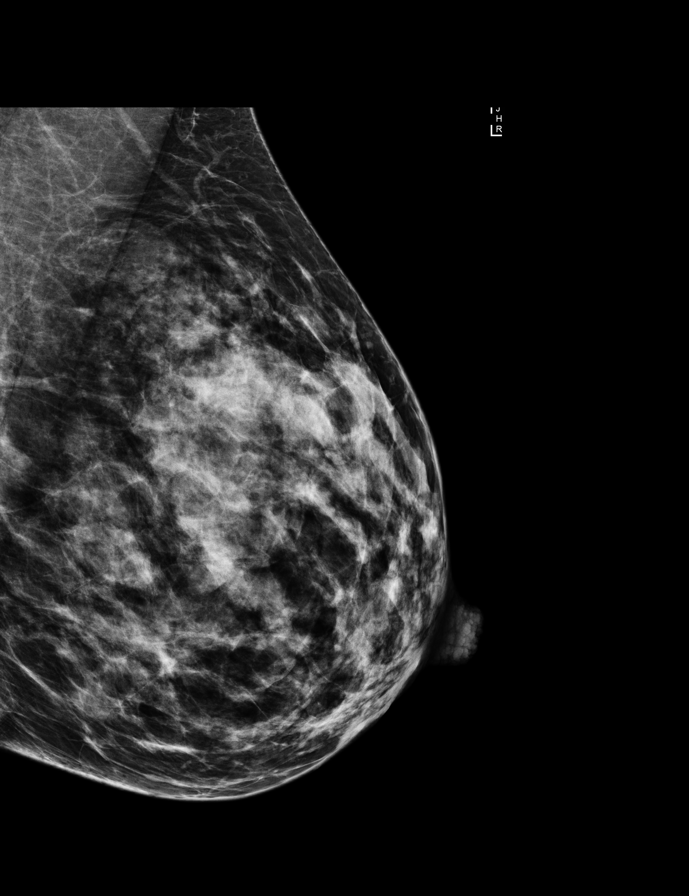

[R ML]
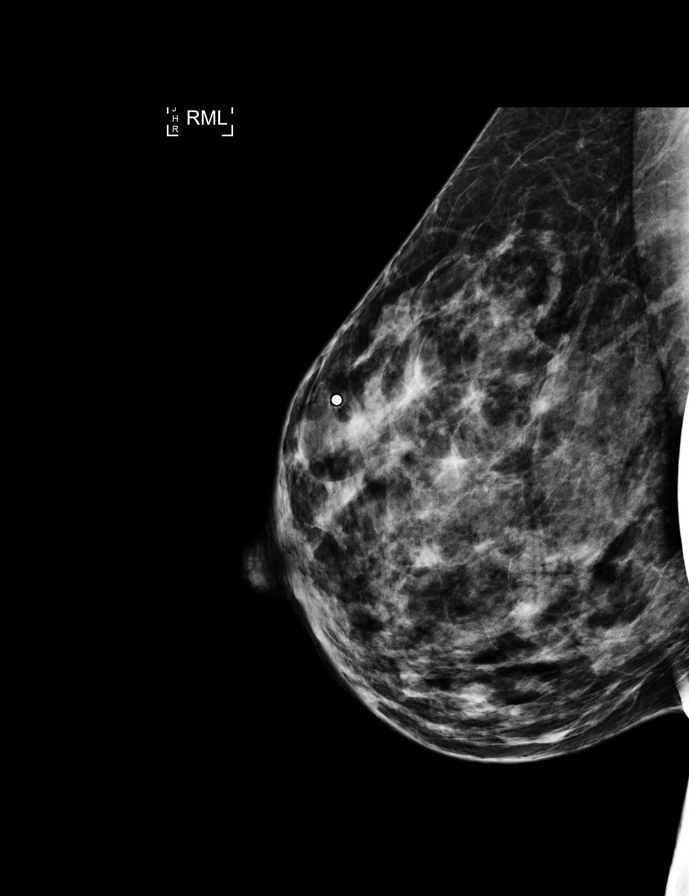

[R ML synth-2D]
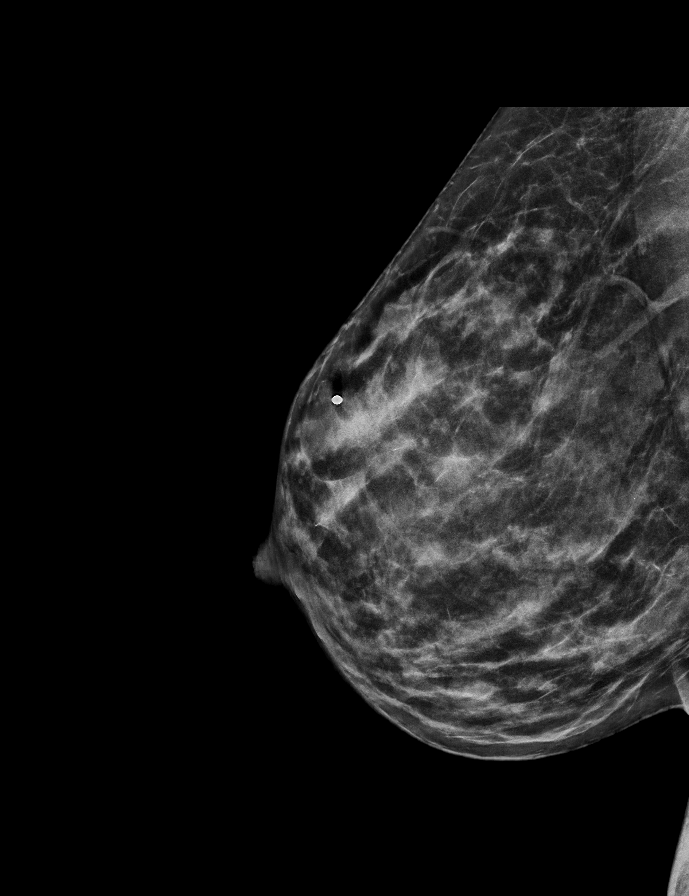

[8 of 40 positions shown; findings below may reference images not displayed]

ACR Breast Density Category c: The breast tissue is heterogeneously
dense, which may obscure small masses.
FINDINGS: A radiopaque BB was placed at the site of the patient's palpable
abnormality in the superior slightly medial right breast at middle
depth. A vague asymmetry seen deep to the radiopaque marker.

No suspicious masses or calcifications are identified in the
remainder of the right breast or within the left breast.

Mammographic images were processed with CAD.

On physical exam, I palpate a hard, mobile 2-3 cm mass at the 1
o'clock periareolar position.

Targeted ultrasound is performed, showing an ill-defined,
hyperechoic mass with internal cystic structures at the 1 o'clock
position 1 cm from the nipple. It measures 3 x 2 x 2 cm. This
correlates with the patient's palpable abnormality. There is no
significant internal vascularity.
IMPRESSION: 1. Probably benign probable right breast hematoma. Patient reports
interval improvement in her symptoms with a a decrease in size of
the mass over the course of 1 week. Recommendation is for ultrasound
follow-up in 1 month to document improvement/resolution.
2. No mammographic evidence of malignancy on the left.

RECOMMENDATION:
Right breast ultrasound in 1 month.

I have discussed the findings and recommendations with the patient.
Results were also provided in writing at the conclusion of the
visit. If applicable, a reminder letter will be sent to the patient
regarding the next appointment.

BI-RADS CATEGORY  3: Probably benign.

## 2018-02-17 ENCOUNTER — Other Ambulatory Visit: Payer: BLUE CROSS/BLUE SHIELD

## 2019-06-15 ENCOUNTER — Other Ambulatory Visit: Payer: Self-pay | Admitting: Family Medicine

## 2019-06-15 DIAGNOSIS — Z1231 Encounter for screening mammogram for malignant neoplasm of breast: Secondary | ICD-10-CM

## 2019-06-15 DIAGNOSIS — N63 Unspecified lump in unspecified breast: Secondary | ICD-10-CM
